# Patient Record
Sex: Male | Born: 1965 | Hispanic: No | Marital: Single
Health system: Southern US, Community
[De-identification: ages and names within clinical notes are randomized; demographics above are authoritative.]

---

## 2021-10-28 ENCOUNTER — Encounter: Payer: Self-pay | Admitting: Radiology

## 2021-10-28 ENCOUNTER — Other Ambulatory Visit: Payer: Self-pay

## 2021-10-28 ENCOUNTER — Emergency Department
Admission: EM | Admit: 2021-10-28 | Discharge: 2021-10-28 | Disposition: A | Discharge status: Home / Self Care | Payer: Self-pay | Attending: Emergency Medicine | Admitting: Emergency Medicine

## 2021-10-28 ENCOUNTER — Emergency Department: Payer: Self-pay

## 2021-10-28 DIAGNOSIS — I10 Essential (primary) hypertension: Secondary | ICD-10-CM | POA: Insufficient documentation

## 2021-10-28 DIAGNOSIS — K921 Melena: Secondary | ICD-10-CM | POA: Insufficient documentation

## 2021-10-28 LAB — CBC WITH DIFFERENTIAL/PLATELET
Abs Immature Granulocytes: 0.01 10*3/uL (ref 0.00–0.07)
Basophils Absolute: 0 10*3/uL (ref 0.0–0.1)
Basophils Relative: 1 %
Eosinophils Absolute: 0.4 10*3/uL (ref 0.0–0.5)
Eosinophils Relative: 6 %
HCT: 46.4 % (ref 39.0–52.0)
Hemoglobin: 15.9 g/dL (ref 13.0–17.0)
Immature Granulocytes: 0 %
Lymphocytes Relative: 32 %
Lymphs Abs: 2 10*3/uL (ref 0.7–4.0)
MCH: 29.6 pg (ref 26.0–34.0)
MCHC: 34.3 g/dL (ref 30.0–36.0)
MCV: 86.4 fL (ref 80.0–100.0)
Monocytes Absolute: 0.5 10*3/uL (ref 0.1–1.0)
Monocytes Relative: 8 %
Neutro Abs: 3.5 10*3/uL (ref 1.7–7.7)
Neutrophils Relative %: 53 %
Platelets: 240 10*3/uL (ref 150–400)
RBC: 5.37 MIL/uL (ref 4.22–5.81)
RDW: 13.1 % (ref 11.5–15.5)
WBC: 6.4 10*3/uL (ref 4.0–10.5)
nRBC: 0 % (ref 0.0–0.2)

## 2021-10-28 LAB — COMPREHENSIVE METABOLIC PANEL
ALT: 44 U/L (ref 0–44)
AST: 32 U/L (ref 15–41)
Albumin: 4.1 g/dL (ref 3.5–5.0)
Alkaline Phosphatase: 67 U/L (ref 38–126)
Anion gap: 8 (ref 5–15)
BUN: 17 mg/dL (ref 6–20)
CO2: 25 mmol/L (ref 22–32)
Calcium: 8.9 mg/dL (ref 8.9–10.3)
Chloride: 108 mmol/L (ref 98–111)
Creatinine, Ser: 1.26 mg/dL — ABNORMAL HIGH (ref 0.61–1.24)
GFR, Estimated: >60 mL/min (ref >60)
Glucose, Bld: 110 mg/dL — ABNORMAL HIGH (ref 70–99)
Potassium: 3.5 mmol/L (ref 3.5–5.1)
Sodium: 141 mmol/L (ref 135–145)
Total Bilirubin: 0.5 mg/dL (ref 0.3–1.2)
Total Protein: 7.9 g/dL (ref 6.5–8.1)

## 2021-10-28 LAB — PROTIME-INR
INR: 1 (ref 0.8–1.2)
Prothrombin Time: 12.8 seconds (ref 11.4–15.2)

## 2021-10-28 LAB — TYPE AND SCREEN
ABO/RH(D): O POS
Antibody Screen: NEGATIVE

## 2021-10-28 LAB — LIPASE, BLOOD: Lipase: 33 U/L (ref 11–51)

## 2021-10-28 LAB — LACTIC ACID, PLASMA: Lactic Acid, Venous: 0.7 mmol/L (ref 0.5–1.9)

## 2021-10-28 MED ORDER — HYDROCORTISONE ACETATE 25 MG RE SUPP: 25.0000 mg | Freq: Two times a day (BID) | RECTAL | 0 refills | Status: AC

## 2021-10-28 MED ORDER — AMLODIPINE BESYLATE 5 MG PO TABS: 5.0000 mg | ORAL_TABLET | Freq: Every day | ORAL | 11 refills | Status: AC

## 2021-10-28 MED ORDER — AMLODIPINE BESYLATE 5 MG PO TABS
5.0000 mg | ORAL_TABLET | Freq: Once | ORAL | Status: AC
Start: 2021-10-28 — End: 2021-10-28
  Administered 2021-10-28: 5 mg via ORAL
  Filled 2021-10-28: qty 1

## 2021-10-28 MED ORDER — IOHEXOL 350 MG/ML SOLN
100.0000 mL | Freq: Once | INTRAVENOUS | Status: AC | PRN
  Administered 2021-10-28: 100 mL via INTRAVENOUS

## 2021-10-28 MED ORDER — DOCUSATE SODIUM 100 MG PO CAPS: 100.0000 mg | ORAL_CAPSULE | Freq: Every day | ORAL | 0 refills | Status: AC

## 2021-10-28 NOTE — ED Provider Notes (Signed)
? ?[facility] ?Provider Note ? ? ? None  ?  (approximate) ? ? ?History  ? ?Melena and Hypertension ? ? ?HPI ? ?Seth Frost is a 56 y.o. male here with bright red blood per rectum.  The patient states that intermittently for the last 4 weeks, he has had bright red blood per rectum.  He states it is often with a brown bowel movement.  He states he has small clots around his bowel movement.  He has otherwise been well.  Denies any fevers or chills.  No weight loss.  No night sweats.  Does not take blood thinners.  Denies history of diverticulosis.  He has never had a colonoscopy.  No recent weight changes.  He went to urgent care and was sent here because he was notably hypertensive.  He states that he believes he has had hypertension. No cp, sob. ?  ? ? ?Physical Exam  ? ?Triage Vital Signs: ?ED Triage Vitals  ?Enc Vitals Group  ?   BP 10/28/21 1713 (!) 199/108  ?   Pulse Rate 10/28/21 1713 60  ?   Resp 10/28/21 1713 18  ?   Temp 10/28/21 1717 99.4 ?F (37.4 ?C)  ?   Temp Source 10/28/21 1717 Oral  ?   SpO2 10/28/21 1713 96 %  ?   Weight --   ?   Height --   ?   Head Circumference --   ?   Peak Flow --   ?   Pain Score 10/28/21 1715 0  ?   Pain Loc --   ?   Pain Edu? --   ?   Excl. in GC? --   ? ? ?Most recent vital signs: ?Vitals:  ? 10/28/21 2000 10/28/21 2100  ?BP: (!) 160/102 (!) 158/103  ?Pulse: (!) 56 (!) 55  ?Resp: 14 14  ?Temp:    ?SpO2: 93% 95%  ? ? ? ?General: Awake, no distress.  ?CV:  Good peripheral perfusion.  ?Resp:  Normal effort.  ?Abd:  No distention.  No tenderness.  No rebound or guarding.  No left lower quadrant tenderness. ?Other:  Extremities warm and well-perfused. ? ? ?ED Results / Procedures / Treatments  ? ?Labs ?(all labs ordered are listed, but only abnormal results are displayed) ?Labs Reviewed  ?COMPREHENSIVE METABOLIC PANEL - Abnormal; Notable for the following components:  ?    Result Value  ? Glucose, Bld 110 (*)   ? Creatinine, Ser 1.26 (*)   ? All  other components within normal limits  ?CBC WITH DIFFERENTIAL/PLATELET  ?LIPASE, BLOOD  ?LACTIC ACID, PLASMA  ?PROTIME-INR  ?TYPE AND SCREEN  ? ? ? ?EKG ? ? ? ?RADIOLOGY ?CT abdomen/pelvis: No acute intracranial abnormality, no aneurysm, no signs of GI bleed ? ? ?I also independently reviewed and agree wit radiologist interpretations. ? ? ?PROCEDURES: ? ?Critical Care performed: No ? ? ? ? ?MEDICATIONS ORDERED IN ED: ?Medications  ?iohexol (OMNIPAQUE) 350 MG/ML injection 100 mL (100 mLs Intravenous Contrast Given 10/28/21 1834)  ?amLODipine (NORVASC) tablet 5 mg (5 mg Oral Given 10/28/21 2111)  ? ? ? ?IMPRESSION / MDM / ASSESSMENT AND PLAN / ED COURSE  ?I reviewed the triage vital signs and the nursing notes. ?             ?               ? ? ?The patient is on the cardiac monitor to evaluate for evidence of arrhythmia  and/or significant heart rate changes. ? ? ?MDM:  ?56 year old male here with intermittent blood per rectum as well as hypertension.  Regarding his bleeding, patient has a history of hemorrhoids and I suspect this is the etiology of his current symptoms.  The patient provided pictures of his stool and he seems to be having intermittent clots/liquid stool around brown stool.  No known history of diverticulosis.  No melena or symptoms to suggest upper GI bleed.  Given that he has never had colonoscopy or imaging, CT angio obtained, reviewed, and fortunately shows no evidence of AAA, bleeding, diverticulosis, or other acute abnormality.  His lab work is very reassuring with normal hemoglobin, platelets, no signs of liver disease or renal abnormalities.  From this perspective, will start Colace and suppositories with return precautions.  Otherwise, do not feel he needs admission from this perspective given stable vitals, chronicity of his findings, normal CT, and otherwise lack of significant risk factors.  Regarding his blood pressure, this seems to be a chronic issue.  He has no headache, vision changes,  chest pain, shortness of breath, no signs of hypertensive urgency or emergency.  Will start him on low-dose amlodipine and refer him to a PCP.   ? ? ?MEDICATIONS GIVEN IN ED: ?Medications  ?iohexol (OMNIPAQUE) 350 MG/ML injection 100 mL (100 mLs Intravenous Contrast Given 10/28/21 1834)  ?amLODipine (NORVASC) tablet 5 mg (5 mg Oral Given 10/28/21 2111)  ? ? ? ?Consults:  ? ? ? ?EMR reviewed  ?None available ? ? ? ? ?FINAL CLINICAL IMPRESSION(S) / ED DIAGNOSES  ? ?Final diagnoses:  ?Primary hypertension  ?Blood in stool  ? ? ? ?Rx / DC Orders  ? ?ED Discharge Orders   ? ?      Ordered  ?  docusate sodium (COLACE) 100 MG capsule  Daily       ? 10/28/21 2058  ?  hydrocortisone (ANUSOL-HC) 25 MG suppository  Every 12 hours       ? 10/28/21 2058  ?  amLODipine (NORVASC) 5 MG tablet  Daily       ? 10/28/21 2058  ? ?  ?  ? ?  ? ? ? ?Note:  This document was prepared using Dragon voice recognition software and may include unintentional dictation errors. ?  ?Shaune Pollack, MD ?10/29/21 0200 ? ?

## 2021-10-28 NOTE — ED Triage Notes (Signed)
Pt presents to ED with c/o of blood in stools. Pt states blood is bright red in color. Pt reports HX of hemmroids and states this episode has been on going for 3-4 weeks. Pt denies SOB or fatigue. Pt reports no issue with straining for BM.  ?

## 2021-11-09 ENCOUNTER — Other Ambulatory Visit: Payer: Self-pay

## 2021-11-09 ENCOUNTER — Encounter: Payer: Self-pay | Admitting: Emergency Medicine

## 2021-11-09 ENCOUNTER — Emergency Department: Payer: Self-pay

## 2021-11-09 ENCOUNTER — Emergency Department
Admission: EM | Admit: 2021-11-09 | Discharge: 2021-11-09 | Disposition: A | Discharge status: Home / Self Care | Payer: Self-pay | Attending: Emergency Medicine | Admitting: Emergency Medicine

## 2021-11-09 DIAGNOSIS — N132 Hydronephrosis with renal and ureteral calculous obstruction: Secondary | ICD-10-CM | POA: Insufficient documentation

## 2021-11-09 DIAGNOSIS — N200 Calculus of kidney: Secondary | ICD-10-CM

## 2021-11-09 DIAGNOSIS — I10 Essential (primary) hypertension: Secondary | ICD-10-CM | POA: Insufficient documentation

## 2021-11-09 DIAGNOSIS — Z79899 Other long term (current) drug therapy: Secondary | ICD-10-CM | POA: Insufficient documentation

## 2021-11-09 DIAGNOSIS — R109 Unspecified abdominal pain: Secondary | ICD-10-CM

## 2021-11-09 LAB — URINALYSIS, COMPLETE (UACMP) WITH MICROSCOPIC
Bacteria, UA: NONE SEEN
Bilirubin Urine: NEGATIVE
Glucose, UA: 50 mg/dL — AB
Ketones, ur: 5 mg/dL — AB
Leukocytes,Ua: NEGATIVE
Nitrite: NEGATIVE
Protein, ur: NEGATIVE mg/dL
Specific Gravity, Urine: 1.01 (ref 1.005–1.030)
WBC, UA: NONE SEEN WBC/hpf (ref 0–5)
pH: 7 (ref 5.0–8.0)

## 2021-11-09 LAB — CBC
HCT: 47 % (ref 39.0–52.0)
Hemoglobin: 15.9 g/dL (ref 13.0–17.0)
MCH: 29.4 pg (ref 26.0–34.0)
MCHC: 33.8 g/dL (ref 30.0–36.0)
MCV: 87 fL (ref 80.0–100.0)
Platelets: 221 10*3/uL (ref 150–400)
RBC: 5.4 MIL/uL (ref 4.22–5.81)
RDW: 12.9 % (ref 11.5–15.5)
WBC: 7.9 10*3/uL (ref 4.0–10.5)
nRBC: 0 % (ref 0.0–0.2)

## 2021-11-09 LAB — COMPREHENSIVE METABOLIC PANEL
ALT: 59 U/L — ABNORMAL HIGH (ref 0–44)
AST: 36 U/L (ref 15–41)
Albumin: 4.2 g/dL (ref 3.5–5.0)
Alkaline Phosphatase: 85 U/L (ref 38–126)
Anion gap: 11 (ref 5–15)
BUN: 14 mg/dL (ref 6–20)
CO2: 21 mmol/L — ABNORMAL LOW (ref 22–32)
Calcium: 9.2 mg/dL (ref 8.9–10.3)
Chloride: 106 mmol/L (ref 98–111)
Creatinine, Ser: 1.1 mg/dL (ref 0.61–1.24)
GFR, Estimated: >60 mL/min (ref >60)
Glucose, Bld: 162 mg/dL — ABNORMAL HIGH (ref 70–99)
Potassium: 3.7 mmol/L (ref 3.5–5.1)
Sodium: 138 mmol/L (ref 135–145)
Total Bilirubin: 0.6 mg/dL (ref 0.3–1.2)
Total Protein: 8.1 g/dL (ref 6.5–8.1)

## 2021-11-09 LAB — LIPASE, BLOOD: Lipase: 91 U/L — ABNORMAL HIGH (ref 11–51)

## 2021-11-09 IMAGING — CT CT ANGIO CHEST-ABD-PELV FOR DISSECTION W/ AND WO/W CM
2 of 7 series · 13 of 46 positions shown, 15 images · IV contrast (APPLIED)
Comparison: [DATE].

CLINICAL DATA: Chest pain or back pain, aortic dissection suspected

EXAM:
CT ANGIOGRAPHY CHEST, ABDOMEN AND PELVIS
TECHNIQUE: Non-contrast CT of the chest was initially obtained.

[Series 5: axial arterial · axial · arterial · 0.79mm/px · z∈[-533,+52]mm · 10 of 221 slices shown, 12 images]
[im 13/221  soft-tissue]
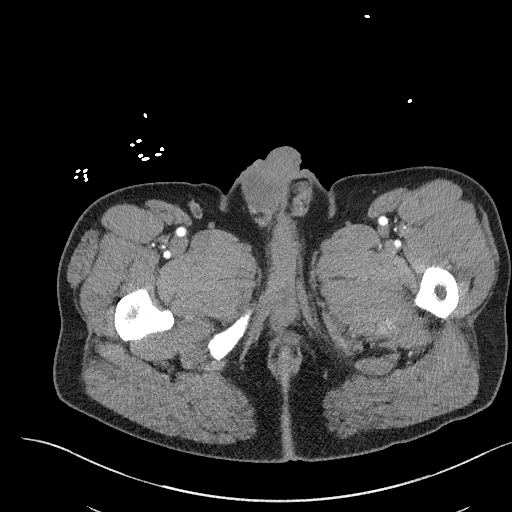
[im 13/221  bone]
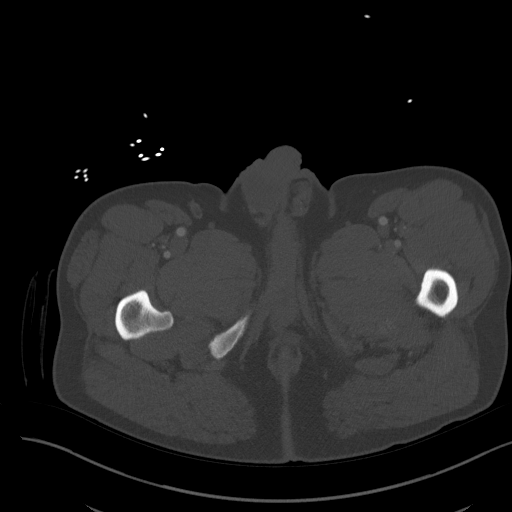
[im 39/221  soft-tissue]
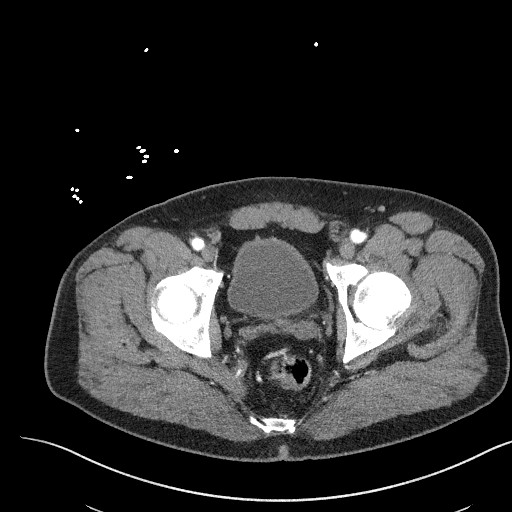
[im 65/221  soft-tissue]
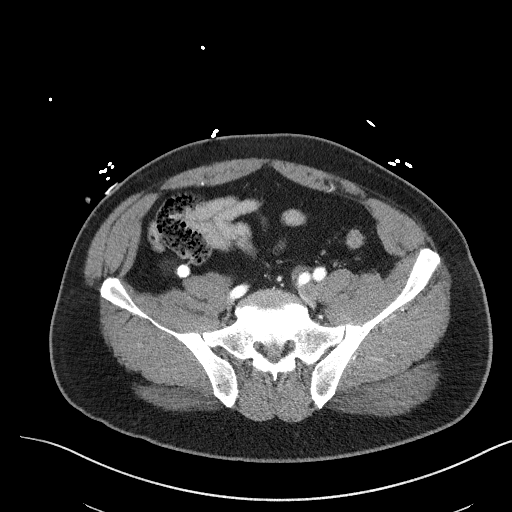
[im 78/221  soft-tissue]
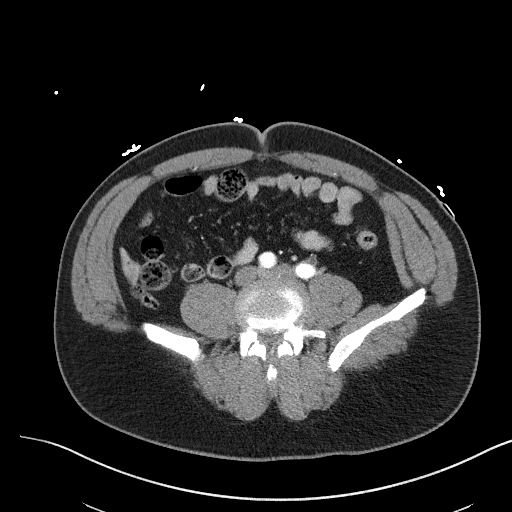
[im 104/221  soft-tissue]
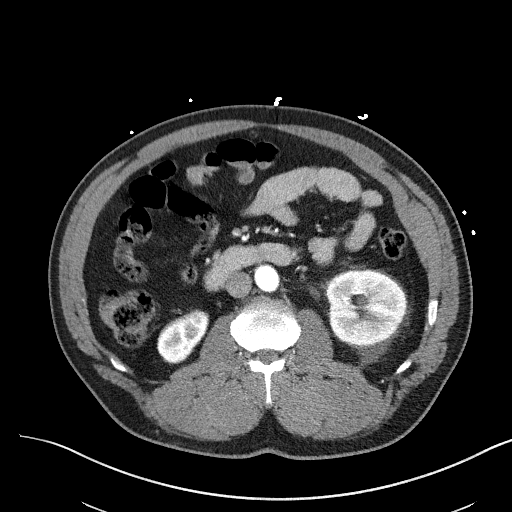
[im 117/221  soft-tissue]
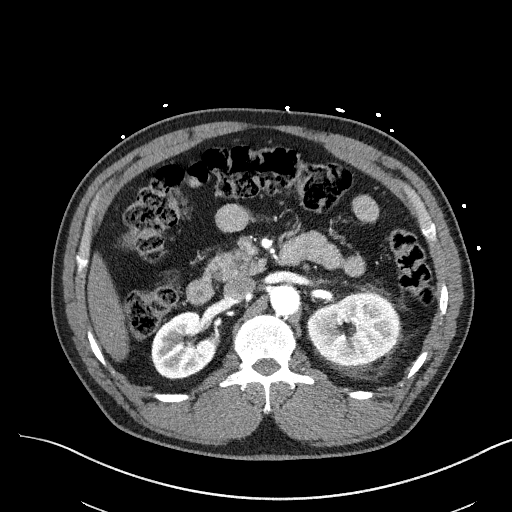
[im 143/221  soft-tissue]
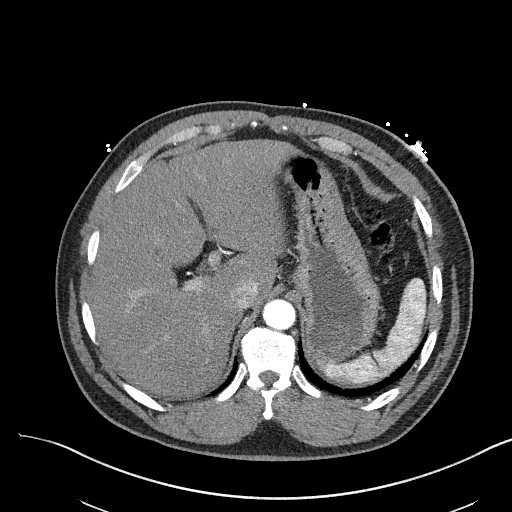
[im 169/221  soft-tissue]
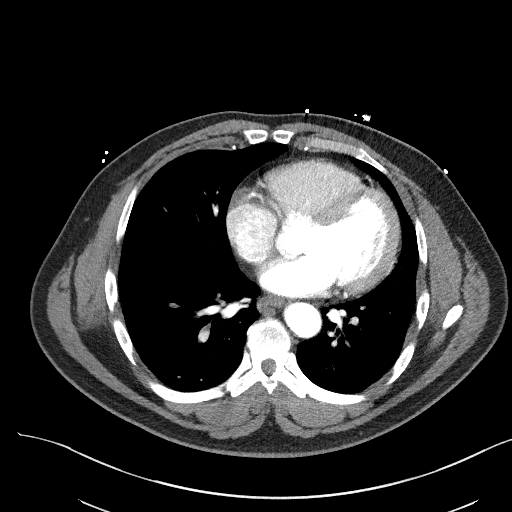
[im 182/221  soft-tissue]
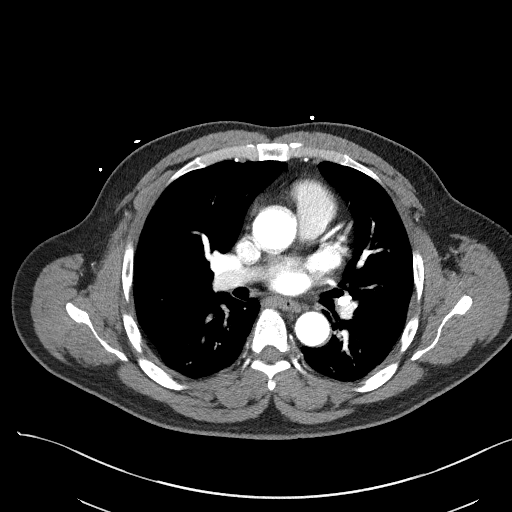
[im 182/221  bone]
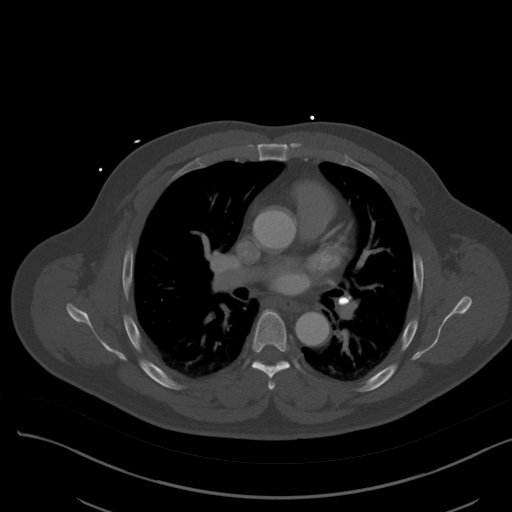
[im 208/221  soft-tissue]
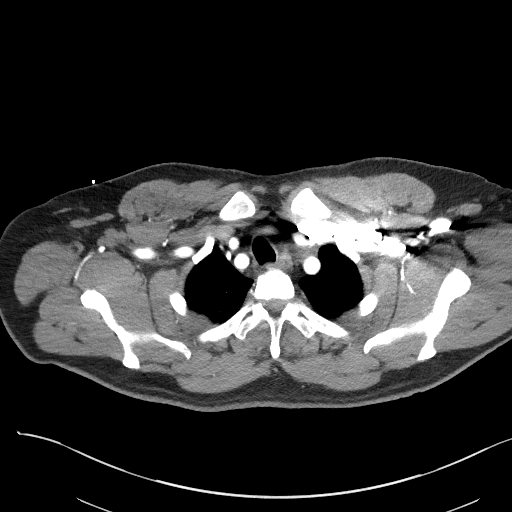

[Series 8: coronals · coronal · 0.79mm/px · 3 of 151 slices shown]
[im 38/151  soft-tissue]
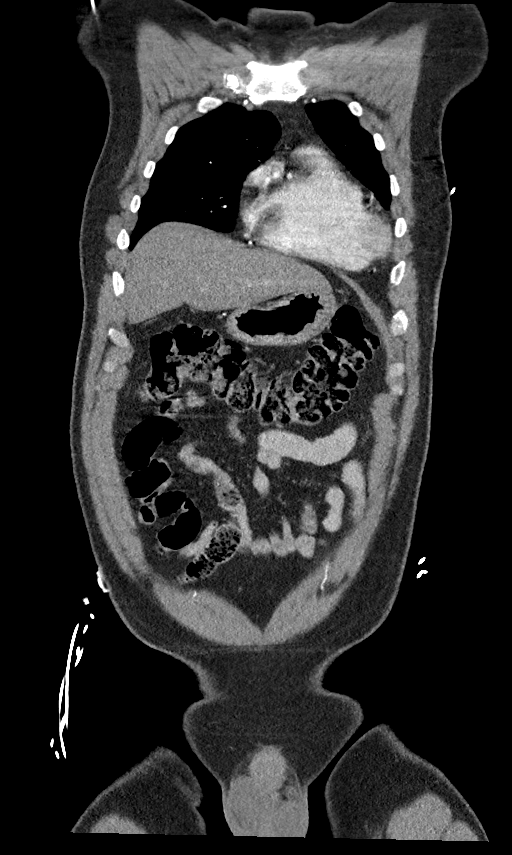
[im 76/151  soft-tissue]
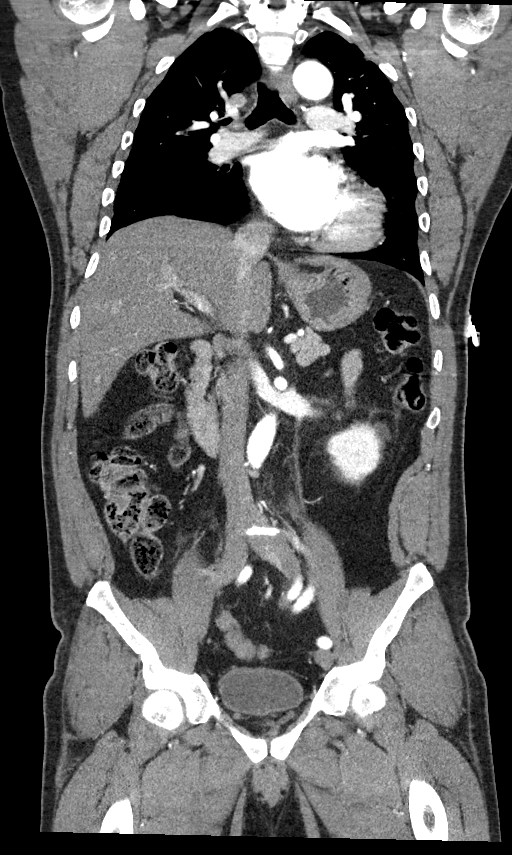
[im 113/151  soft-tissue]
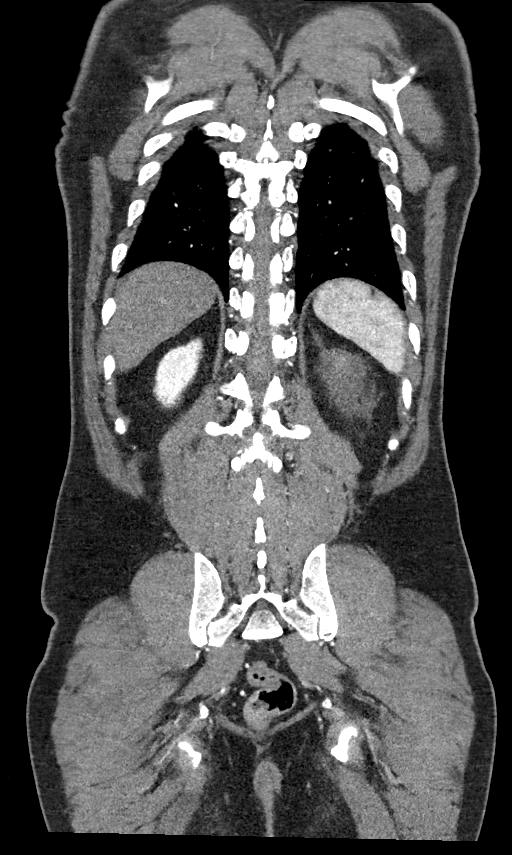

[13 of 46 positions shown; findings below may reference images not displayed]

Multidetector CT imaging through the chest, abdomen and pelvis was
performed using the standard protocol during bolus administration of
intravenous contrast. Multiplanar reconstructed images and MIPs were
obtained and reviewed to evaluate the vascular anatomy.

RADIATION DOSE REDUCTION: This exam was performed according to the
departmental dose-optimization program which includes automated
exposure control, adjustment of the mA and/or kV according to
patient size and/or use of iterative reconstruction technique.

CONTRAST:  100mL OMNIPAQUE IOHEXOL 350 MG/ML SOLN
FINDINGS: CTA CHEST FINDINGS

Cardiovascular: Preferential opacification of the thoracic aorta. No
evidence of thoracic aortic aneurysm or dissection. Normal heart
size. No pericardial effusion.

Mediastinum/Nodes: No enlarged mediastinal, hilar, or axillary lymph
nodes. Calcified mediastinal and left hilar lymph nodes. Thyroid
gland, trachea, and esophagus demonstrate no significant findings.

Lungs/Pleura: No pleural effusion. Calcified granuloma identified
within the left lower lobe. No airspace consolidation, atelectasis,
or pneumothorax. No suspicious lung nodules.

Musculoskeletal: No chest wall abnormality. No acute or significant
osseous findings.

Review of the MIP images confirms the above findings.

CTA ABDOMEN AND PELVIS FINDINGS

VASCULAR

Aorta: Normal caliber aorta without aneurysm, dissection, vasculitis
or significant stenosis.

Celiac: Patent without evidence of aneurysm, dissection, vasculitis
or significant stenosis.

SMA: Patent without evidence of aneurysm, dissection, vasculitis or
significant stenosis.

Renals: Both renal arteries are patent without evidence of aneurysm,
dissection, vasculitis, fibromuscular dysplasia or significant
stenosis.

IMA: Patent without evidence of aneurysm, dissection, vasculitis or
significant stenosis.

Inflow: Patent without evidence of aneurysm, dissection, vasculitis
or significant stenosis.

Veins: No obvious venous abnormality within the limitations of this
arterial phase study.

Review of the MIP images confirms the above findings.

NON-VASCULAR

Hepatobiliary: Mild hepatic steatosis. 6 mm low-density structure in
right lobe of liver is too small to characterize. Gallbladder
normal.

Pancreas: Unremarkable. No pancreatic ductal dilatation or
surrounding inflammatory changes.

Spleen: Normal in size without focal abnormality.

Adrenals/Urinary Tract: Normal adrenal glands. Small punctate stones
noted within the upper and mid right renal collecting systems. No
right-sided hydronephrosis, hydroureter or ureteral lithiasis. There
is left-sided nephromegaly and perinephric fat stranding with mild
hydronephrosis and hydroureter. There is a stone at the left UVJ
measuring 4 mm, image 181/5. Bladder is otherwise unremarkable.

Stomach/Bowel: Stomach appears normal. The appendix is visualized
and is within normal limits. No bowel wall thickening, inflammation,
or distension.

Lymphatic: No enlarged abdominopelvic lymph nodes.

Reproductive: Prostate is unremarkable.

Other: No free fluid or fluid collections.

Musculoskeletal: No acute or significant osseous findings.

Review of the MIP images confirms the above findings.
IMPRESSION: 1. No evidence for aortic dissection or aneurysm.
2. Left-sided hydronephrosis and hydroureter secondary to 4 mm
distal left ureteral calculi at the level of the left UVJ.
3. Nonobstructing right renal calculi.
4. Hepatic steatosis.

## 2021-11-09 MED ORDER — OXYCODONE-ACETAMINOPHEN 5-325 MG PO TABS: 1.0000 | ORAL_TABLET | ORAL | 0 refills | Status: AC | PRN

## 2021-11-09 MED ORDER — HYDROMORPHONE HCL 1 MG/ML IJ SOLN
1.0000 mg | INTRAMUSCULAR | Status: AC
  Administered 2021-11-09: 1 mg via INTRAVENOUS
  Filled 2021-11-09: qty 1

## 2021-11-09 MED ORDER — IOHEXOL 350 MG/ML SOLN
100.0000 mL | Freq: Once | INTRAVENOUS | Status: AC | PRN
  Administered 2021-11-09: 100 mL via INTRAVENOUS

## 2021-11-09 MED ORDER — HYDROMORPHONE HCL 1 MG/ML IJ SOLN
0.5000 mg | INTRAMUSCULAR | Status: AC
  Administered 2021-11-09: 0.5 mg via INTRAVENOUS
  Filled 2021-11-09: qty 1

## 2021-11-09 MED ORDER — ONDANSETRON HCL 4 MG/2ML IJ SOLN
4.0000 mg | INTRAMUSCULAR | Status: AC
  Administered 2021-11-09: 4 mg via INTRAVENOUS
  Filled 2021-11-09: qty 2

## 2021-11-09 MED ORDER — KETOROLAC TROMETHAMINE 30 MG/ML IJ SOLN
15.0000 mg | Freq: Once | INTRAMUSCULAR | Status: AC
  Administered 2021-11-09: 15 mg via INTRAVENOUS
  Filled 2021-11-09: qty 1

## 2021-11-09 MED ORDER — SODIUM CHLORIDE 0.9 % IV BOLUS
1000.0000 mL | Freq: Once | INTRAVENOUS | Status: AC
Start: 2021-11-09 — End: 2021-11-09
  Administered 2021-11-09: 1000 mL via INTRAVENOUS

## 2021-11-09 MED ORDER — TAMSULOSIN HCL 0.4 MG PO CAPS: 0.4000 mg | ORAL_CAPSULE | Freq: Every day | ORAL | 0 refills | Status: AC

## 2021-11-09 MED ORDER — AMLODIPINE BESYLATE 5 MG PO TABS
5.0000 mg | ORAL_TABLET | Freq: Once | ORAL | Status: AC
  Administered 2021-11-09: 5 mg via ORAL
  Filled 2021-11-09: qty 1

## 2021-11-09 MED ORDER — ONDANSETRON HCL 4 MG PO TABS: 4.0000 mg | ORAL_TABLET | Freq: Three times a day (TID) | ORAL | 0 refills | Status: AC | PRN

## 2021-11-09 NOTE — ED Provider Notes (Signed)
Patient received in signout from Dr. Fanny Bien pending CTA and reassessment.  On my review interpretation of CTA there is no evidence of dissection there does appear to be hydronephrosis on the left with distal left stone.  His renal function is normal therefore will order IV Toradol as he does appear to be in quite a bit of discomfort. ? ?Patient's pain is resolved on reassessment.  Does not have any sign of infected stone.  No white count hemodynamically stable.  Does appear stable and appropriate for outpatient follow-up.  Patient family agreeable to plan. ?  ?Willy Eddy, MD ?11/09/21 [phone] ? ?

## 2021-11-09 NOTE — ED Notes (Signed)
Patient transported to CT 

## 2021-11-09 NOTE — ED Provider Notes (Signed)
? ?[facility] ?Provider Note ? ? ? Event Date/Time  ? First MD Initiated Contact with Patient 11/09/21 0559   ?  (approximate) ? ? ?History  ? ?Flank Pain ? ?Spanish tele-interpreter utilized throughout clinical history gathering and examination ?HPI ? ?Seth Frost is a 56 y.o. male reports a history of hypertension and hemorrhoids ? ?Patient reports he has been in his normal health doing well without concern until about 2 AM when he woke up with severe pain in his mid to lower left back/flank.  It was sudden onset and caused him to vomit and he is having an urge to urinate ? ?He has been recently for about 2 weeks now taking amlodipine for blood pressure. ? ?No headache no chest pain.  No numbness tingling or weakness.  Denies any cold or blue feet or extremities. ? ?The pain is not located in his middle back it is located in the left side of his flank below the rib cage, points towards his left costovertebral angle and reports it feels like a "deep pain" ? ?Denies right-sided pain. (Review of a prior CT scan showed right-sided nephrolithiasis) ? ? ?Reviewed provider note from October 28, 2021.  Patient noted to have hypertension and blood in his stools at that time ?  ?Patient reports an extreme sense of urgency as though he needs to urinate ? ?Physical Exam  ? ?Triage Vital Signs: ?ED Triage Vitals  ?Enc Vitals Group  ?   BP 11/09/21 0602 (!) 235/121  ?   Pulse Rate 11/09/21 0602 (!) 54  ?   Resp 11/09/21 0602 20  ?   Temp 11/09/21 0602 98 ?F (36.7 ?C)  ?   Temp Source 11/09/21 0602 Oral  ?   SpO2 11/09/21 0602 98 %  ?   Weight 11/09/21 0600 186 lb (84.4 kg)  ?   Height 11/09/21 0600 5\' 5"  (1.651 m)  ?   Head Circumference --   ?   Peak Flow --   ?   Pain Score 11/09/21 0600 10  ?   Pain Loc --   ?   Pain Edu? --   ?   Excl. in GC? --   ? ? ?Most recent vital signs: ?Vitals:  ? 11/09/21 0730 11/09/21 0800  ?BP: (!) 184/110 (!) 162/96  ?Pulse: (!) 57 (!) 52  ?Resp: 11 11  ?Temp:     ?SpO2: (!) 89% 95%  ? ? ? ?General: Awake, sitting up holding hand on left flank, appears in pain.  Appears to be in severe pain.  Nauseated ?CV:  Good peripheral perfusion.  Palpable dorsalis pedis pulses in the lower extremities bilaterally.  Normal heart tones normal rate and rhythm, no borderline bradycardia. ?Resp:  Normal effort.  Clear lung sounds bilaterally.  No increased work of breathing ?Abd:  No distention.  Soft nontender nondistended throughout.  Examination of the abdomen does not elucidate or worsen his pain.  Does report pain to percussion over the left costovertebral angle, none noted on the right ?Other:   ? ? ?ED Results / Procedures / Treatments  ? ?Labs ?(all labs ordered are listed, but only abnormal results are displayed) ?Labs Reviewed  ?COMPREHENSIVE METABOLIC PANEL - Abnormal; Notable for the following components:  ?    Result Value  ? CO2 21 (*)   ? Glucose, Bld 162 (*)   ? ALT 59 (*)   ? All other components within normal limits  ?LIPASE, BLOOD - Abnormal; Notable  for the following components:  ? Lipase 91 (*)   ? All other components within normal limits  ?URINALYSIS, COMPLETE (UACMP) WITH MICROSCOPIC - Abnormal; Notable for the following components:  ? Color, Urine STRAW (*)   ? APPearance CLEAR (*)   ? Glucose, UA 50 (*)   ? Hgb urine dipstick MODERATE (*)   ? Ketones, ur 5 (*)   ? All other components within normal limits  ?CBC  ? ? ? ?EKG ? ?Reviewed interpreted by me at 6:30 AM ?Heart rate 60 ?QRS 100 ?QTc 440 ?Normal sinus rhythm, left ventricular hypertrophy with repolarization abnormality. ? ? ?RADIOLOGY ? ? ? ?I personally viewed and interpreted the patient's CT angiogram for gross pathology and did note what appears to be an apparent left-sided kidney stone just at the entrance of the bladder. ? ?CT Angio Chest/Abd/Pel for Dissection W and/or Wo Contrast ? ?Result Date: 11/09/2021 ?CLINICAL DATA:  Chest pain or back pain, aortic dissection suspected EXAM: CT ANGIOGRAPHY  CHEST, ABDOMEN AND PELVIS TECHNIQUE:  ? ?IMPRESSION: 1. No evidence for aortic dissection or aneurysm. 2. Left-sided hydronephrosis and hydroureter secondary to 4 mm distal left ureteral calculi at the level of the left UVJ. 3. Nonobstructing right renal calculi. 4. Hepatic steatosis. Electronically Signed   By: Kerby Moors M.D.   On: 11/09/2021 07:39   ? ? ?PROCEDURES: ? ?Critical Care performed: No ? ?Procedures ? ? ?MEDICATIONS ORDERED IN ED: ?Medications  ?HYDROmorphone (DILAUDID) injection 1 mg (1 mg Intravenous Given 11/09/21 0616)  ?ondansetron [facility]) injection 4 mg (4 mg Intravenous Given 11/09/21 0616)  ?amLODipine (NORVASC) tablet 5 mg (5 mg Oral Given 11/09/21 0647)  ?iohexol (OMNIPAQUE) 350 MG/ML injection 100 mL (100 mLs Intravenous Contrast Given 11/09/21 0703)  ?ondansetron [facility]) injection 4 mg (4 mg Intravenous Given 11/09/21 0720)  ?HYDROmorphone (DILAUDID) injection 0.5 mg (0.5 mg Intravenous Given 11/09/21 0720)  ?sodium chloride 0.9 % bolus 1,000 mL (1,000 mLs Intravenous New Bag/Given 11/09/21 0724)  ?ketorolac (TORADOL) 30 MG/ML injection 15 mg (15 mg Intravenous Given 11/09/21 0751)  ? ? ? ?IMPRESSION / MDM / ASSESSMENT AND PLAN / ED COURSE  ?I reviewed the triage vital signs and the nursing notes. ?             ?               ? ?Differential diagnosis includes but is not limited to, abdominal perforation, aortic dissection, cholecystitis, appendicitis, diverticulitis, colitis, esophagitis/gastritis, kidney stone, pyelonephritis, urinary tract infection, aortic aneurysm. All are considered in decision and treatment plan. Based upon the patient's presentation and risk factors, and also in the context of his severe hypertension I am concerned about etiology such as nephrolithiasis, infarct, mesenteric ischemia given his recent report of bloody stools, or also aortic dissection.  His blood pressure is severely elevated, though this may be secondary to pain.  His previous CT scan did not  demonstrate nephrolithiasis on the left and he currently reports his pain to be in the left flank.  I think it prudent to reevaluate with CBC metabolic panel, EKG, and CT angiogram to exclude aortic dissection or acute aortic or vascular pathology. ? ? ? ? ?The patient is on the cardiac monitor to evaluate for evidence of arrhythmia and/or significant heart rate changes. ? ?Patient's labs were reviewed and notable for normal cbc, with remaining labs to be followed up by Dr. Quentin Cornwall has received the patient in signout ? ?Ongoing care assigned to Dr. Quentin Cornwall, patient pending CT  angiography as well as further treatments and reassessment stroke.  Acute left flank pain.  Clinical symptoms seem likely to represent nephrolithiasis given the clinical setting, however further affirmation and imaging is recommended.  On his previous CT scan a kidney stone was noted on the right, but none noted on the left previous ? ? ?Clinical Course as of 11/09/21 0815  ?Fri Nov 09, 2021  ?[place] reviewed and interpreted.  CBC normal [MQ]  ?  ?Clinical Course User Index ?[MQ] Delman Kitten, MD  ? ? ? ?FINAL CLINICAL IMPRESSION(S) / ED DIAGNOSES  ? ?Final diagnoses:  ?Flank pain  ?Left flank pain  ? ? ? ?Rx / DC Orders  ? ?ED Discharge Orders   ? ? None  ? ?  ? ? ? ?Note:  This document was prepared using Dragon voice recognition software and may include unintentional dictation errors. ?  Delman Kitten, MD ?11/09/21 0815 ? ?

## 2021-11-09 NOTE — ED Notes (Signed)
Pt given crackers and water for PO challenge

## 2021-11-09 NOTE — ED Notes (Signed)
Pt returned from CT. It is in obvious pain and vomiting. No orders for medication at this time. Will speak with EDP.  ?

## 2021-11-09 NOTE — ED Triage Notes (Signed)
Patient ambulatory to triage with steady gait, without difficulty or distress noted; pt awoke at 2am with left flank pain accomp by N/V; denies hx ?
